# Patient Record
Sex: Female | Born: 1960 | Race: Black or African American | Hispanic: No | Marital: Married | Smoking: Never smoker
Health system: Southern US, Community
[De-identification: ages and names within clinical notes are randomized; demographics above are authoritative.]

## PROBLEM LIST (undated history)

## (undated) DIAGNOSIS — E7800 Pure hypercholesterolemia, unspecified: Secondary | ICD-10-CM

## (undated) DIAGNOSIS — I10 Essential (primary) hypertension: Secondary | ICD-10-CM

## (undated) DIAGNOSIS — M1990 Unspecified osteoarthritis, unspecified site: Secondary | ICD-10-CM

## (undated) DIAGNOSIS — E119 Type 2 diabetes mellitus without complications: Secondary | ICD-10-CM

---

## 2000-04-01 ENCOUNTER — Encounter: Payer: Self-pay | Admitting: Urology

## 2000-04-01 ENCOUNTER — Encounter: Admission: RE | Admit: 2000-04-01 | Discharge: 2000-04-01 | Payer: Self-pay | Admitting: Urology

## 2003-03-30 ENCOUNTER — Other Ambulatory Visit: Admission: RE | Admit: 2003-03-30 | Discharge: 2003-03-30 | Payer: Self-pay | Admitting: Family Medicine

## 2003-04-09 ENCOUNTER — Encounter: Admission: RE | Admit: 2003-04-09 | Discharge: 2003-04-09 | Payer: Self-pay | Admitting: Family Medicine

## 2003-04-19 ENCOUNTER — Encounter: Admission: RE | Admit: 2003-04-19 | Discharge: 2003-04-19 | Payer: Self-pay | Admitting: Family Medicine

## 2004-09-05 ENCOUNTER — Other Ambulatory Visit: Admission: RE | Admit: 2004-09-05 | Discharge: 2004-09-05 | Payer: Self-pay | Admitting: Family Medicine

## 2004-09-22 ENCOUNTER — Encounter: Admission: RE | Admit: 2004-09-22 | Discharge: 2004-09-22 | Payer: Self-pay | Admitting: Family Medicine

## 2005-04-11 ENCOUNTER — Ambulatory Visit (HOSPITAL_COMMUNITY): Admission: RE | Admit: 2005-04-11 | Discharge: 2005-04-11 | Payer: Self-pay | Admitting: Obstetrics and Gynecology

## 2005-04-11 ENCOUNTER — Encounter (INDEPENDENT_AMBULATORY_CARE_PROVIDER_SITE_OTHER): Payer: Self-pay | Admitting: *Deleted

## 2006-03-01 ENCOUNTER — Other Ambulatory Visit: Admission: RE | Admit: 2006-03-01 | Discharge: 2006-03-01 | Payer: Self-pay | Admitting: Family Medicine

## 2007-03-23 ENCOUNTER — Emergency Department (HOSPITAL_COMMUNITY): Admission: EM | Admit: 2007-03-23 | Discharge: 2007-03-23 | Payer: Self-pay | Admitting: Emergency Medicine

## 2007-03-24 ENCOUNTER — Other Ambulatory Visit: Admission: RE | Admit: 2007-03-24 | Discharge: 2007-03-24 | Payer: Self-pay | Admitting: Family Medicine

## 2008-03-29 ENCOUNTER — Other Ambulatory Visit: Admission: RE | Admit: 2008-03-29 | Discharge: 2008-03-29 | Payer: Self-pay | Admitting: Family Medicine

## 2009-04-01 ENCOUNTER — Other Ambulatory Visit: Admission: RE | Admit: 2009-04-01 | Discharge: 2009-04-01 | Payer: Self-pay | Admitting: Family Medicine

## 2010-01-29 ENCOUNTER — Encounter: Payer: Self-pay | Admitting: Family Medicine

## 2010-02-02 ENCOUNTER — Encounter
Admission: RE | Admit: 2010-02-02 | Discharge: 2010-02-02 | Payer: Self-pay | Source: Home / Self Care | Attending: Family Medicine | Admitting: Family Medicine

## 2010-05-26 NOTE — H&P (Signed)
NAME:  Yolanda Graham, Yolanda Graham              ACCOUNT NO.:  [id number]   MEDICAL RECORD NO.:  [id number]          PATIENT TYPE:  AMB   LOCATION:  SDC                           FACILITY:  [facility]   PHYSICIAN:  Charles A. Delcambre, MDDATE OF BIRTH:  1960-07-26   DATE OF ADMISSION:  DATE OF DISCHARGE:                                HISTORY & PHYSICAL   CHIEF COMPLAINT:  Irregular bleeding and postcoital bleeding.   HISTORY OF PRESENT ILLNESS:  A 50 year old para 2-0-0-2, PCP is Dr. Modesto Charon,  Deboraha Sprang at [facility], originally seen for endocervical polyp and now known  to have a large fundal endometrial polyp, to be admitted for hysteroscopy  and D&C for polypectomy.  She is to have clearance from Cardiology as well,  with history of a cardiac murmur.   MEDICAL HISTORY:  1.  Hypercholesterolemia.  2.  Cardiac murmur.  3.  History of kidney stones.   SURGICAL HISTORY:  C-section x1 and SVD x1.   MEDICATIONS:  Calcium, multivitamin and formerly an aspirin once a day.  She  has been off the aspirin for 1 week now at the time of this dictation.   ALLERGIES:  NO KNOWN DRUG ALLERGIES.   SOCIAL HISTORY:  No tobacco, ethanol or drug use, or STD exposure in the  past.  She is married and in a monogamous relationship with her husband.   FAMILY HISTORY:  Hypertension in her father.  She denies family history of  breast, uterus, ovaries, cervix, colon cancer and lymphoma, coronary artery  disease, stroke or diabetes.   REVIEW OF SYSTEMS:  She denies fever or chills.  No new rashes or lesions,  headaches or dizziness.  She does have some seasonal allergies.  No chest  pain, shortness of breath or wheezing.  No diarrhea, constipation, bleeding,  melena or hematochezia, urgency, frequency, dysuria incontinence or  hematuria.  No galactorrhea or emotional changes.   PHYSICAL EXAMINATION:  GENERAL:  Alert and oriented x3 in no distress.  Blood pressure 122/70, respirations 20, heart rate 80 and weight 126  pounds.  HEENT EXAMINATION:  Grossly within normal limits.  NECK:  Supple without thyromegaly or adenopathy.  LUNGS:  Clear bilaterally.  BACK:  No CVAT.  Vertebral column nontender to palpation.  HEART:  Regular rate and rhythm without a murmur that I could distinguish at  the apex or left sternal border, grossly, in the sitting position today with  general exam.  BREAST EXAM:  Breast are symmetrical, otherwise deferred.  ABDOMEN:  Soft, flat and nontender.  No hepatosplenomegaly or other masses  noted.  PELVIC EXAM:  Normal external female genitalia.  Bartholin's, urethral and  Skene's within normal limits.  Vulva without discharge or lesions.  Multiparous cervix is noted.  Uterus is not enlarged.  Adnexa nontender  without masses bilaterally.  Ovaries palpable and within normal size  bilaterally.  Anus and perineal body appear normal.  RECTAL EXAM:  Not done.   ASSESSMENT:  Irregular bleeding and postcoital bleeding.  A known  endometrial polyp on sonohystogram.   PLAN:  Admit for hysteroscopy, D&C and polypectomy on April 11, 2005.  She  gives informed consent and accepts the risks for uterine perforation, a  sorbitol overload, electrolyte imbalance, anesthesia risks in general,  infection, bleeding, damage to bowel or bladder, or vascular structures,  transfusion risks of hepatitis and HIV exposure and failed procedure.  All  questions are answered.  She gives the informed consent in this regard.  Preoperatively, we will get a CBC, a CMET and serum pregnancy test, an EKG  and chest per Anesthesia.  Secondary to the murmur and cardiac history, we  will preop for mitral valve prolapse, endocarditis prophylaxis with  ampicillin 2 g plus gentamicin 1.5 mg/kg or 120 mg, whichever is less, IV in  the preop holding.  She is also to get surgical clearance from Cardiology  and has this appointment within the next several days prior to surgery and  per Dr. Nash Dimmer recommendation as well,  and we will look for this clearance  prior to surgery.  All questions are answered and she will remain n.p.o.  past midnight the evening prior to surgery, and we will proceed as outlined.      Charles A. Sydnee Cabal, MD  Electronically Signed     CAD/MEDQ  D:  04/03/2005  T:  04/04/2005  Job:  [id number]

## 2010-05-26 NOTE — Op Note (Signed)
NAME:  Yolanda Graham, Yolanda Graham              ACCOUNT NO.:  [id number]   MEDICAL RECORD NO.:  [id number]          PATIENT TYPE:  AMB   LOCATION:  SDC                           [facility]ILITY:  [facility]   PHYSICIAN:  Charles A. Delcambre, MDDATE OF BIRTH:  1960-04-21   DATE OF PROCEDURE:  DATE OF DISCHARGE:                                 OPERATIVE REPORT   PREOPERATIVE DIAGNOSES:  1.  Irregular bleeding.  2.  Postcoital bleeding.  3.  Endometrial polyp.   POSTOPERATIVE DIAGNOSES:  1.  Irregular bleeding.  2.  Postcoital bleeding.  3.  Endometrial polyp.   PROCEDURES:  1.  Paracervical block.  2.  Hysteroscopy.  3.  Dilation and curettage.  4.  Polypectomy.   SURGEON:  Charles A. Sydnee Cabal, M.D.   ASSISTANT:  None.   COMPLICATIONS:  None.   ESTIMATED BLOOD LOSS:  Less than 25 mL.   SPECIMENS:  Endometrial polyp and endometrial curettings to pathology.   ANESTHESIA:  General by the laryngeal route.   FINDINGS:  A large fundal endometrial polyp noted, normal cavity otherwise.   SORBITOL LOSS:  Nil.   Instrument, sponge and needle count correct x2.   DESCRIPTION OF PROCEDURE:  The patient was taken to the operating room and  placed in the supine position.  General anesthetic was used without  difficulty.  She was then placed in the dorsal lithotomy position and  universal stirrups and a sterile prep and drape was undertaken.  A weighted  speculum was placed in the vagina.  The anterior lip of the cervix was  grasped with a single-tooth tenaculum.  A paracervical block with 0.25%  plain Marcaine was placed at 4 and 8 o'clock, a total of 10 mL divided  equally without intravascular location of the injection noted.  The sound  was to 7 cm.  Next dilators were used to dilate enough to pass the 5 mm  hysteroscope.  The hysteroscope was passed and the fundal polyp was noted.  Findings are as noted above.  Using polyp forceps, the polyp was removed.  Circumferential curetting with the banjo  curette was then undertaken.  Specimens were sent to pathology.  There was no evidence of perforation.  All instruments were removed.  Hemostasis was excellent at the tenaculum  site as well as the uterus.  The patient tolerated the procedure well and  the procedure was terminated.  The patient was taken to the recovery room in  good condition with physician in attendance and having tolerated the  procedure well.      Charles A. Sydnee Cabal, MD  Electronically Signed     CAD/MEDQ  D:  04/11/2005  T:  04/12/2005  Job:  [id number]

## 2010-05-26 NOTE — H&P (Signed)
NAME:  Yolanda Graham, Yolanda Graham              ACCOUNT NO.:  [id number]   MEDICAL RECORD NO.:  [id number]          PATIENT TYPE:  AMB   LOCATION:  SDC                           [facility]   PHYSICIAN:  Charles A. Delcambre, MDDATE OF BIRTH:  08-30-1960   DATE OF ADMISSION:  DATE OF DISCHARGE:                                HISTORY & PHYSICAL   ADDENDUM:  This patient did have a workup with cardiologist, Everette Rank,  M.D. 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      Charles A. Sydnee Cabal, MD  Electronically Signed     CAD/MEDQ  D:  04/05/2005  T:  04/05/2005  Job:  [id number]

## 2010-07-03 ENCOUNTER — Ambulatory Visit (HOSPITAL_COMMUNITY)
Admission: RE | Admit: 2010-07-03 | Discharge: 2010-07-03 | Disposition: A | Discharge status: Home / Self Care | Payer: PRIVATE HEALTH INSURANCE | Source: Ambulatory Visit | Attending: Urology | Admitting: Urology

## 2010-07-03 DIAGNOSIS — N200 Calculus of kidney: Secondary | ICD-10-CM | POA: Insufficient documentation

## 2012-05-23 ENCOUNTER — Other Ambulatory Visit: Payer: Self-pay | Admitting: Family Medicine

## 2012-05-23 ENCOUNTER — Other Ambulatory Visit (HOSPITAL_COMMUNITY)
Admission: RE | Admit: 2012-05-23 | Discharge: 2012-05-23 | Disposition: A | Discharge status: Home / Self Care | Payer: PRIVATE HEALTH INSURANCE | Source: Ambulatory Visit | Attending: Family Medicine | Admitting: Family Medicine

## 2012-05-23 DIAGNOSIS — Z124 Encounter for screening for malignant neoplasm of cervix: Secondary | ICD-10-CM | POA: Insufficient documentation

## 2013-05-29 ENCOUNTER — Other Ambulatory Visit: Payer: Self-pay | Admitting: Family Medicine

## 2013-05-29 DIAGNOSIS — Z1231 Encounter for screening mammogram for malignant neoplasm of breast: Secondary | ICD-10-CM

## 2013-08-07 ENCOUNTER — Other Ambulatory Visit: Payer: Self-pay | Admitting: Family Medicine

## 2013-08-07 ENCOUNTER — Ambulatory Visit
Admission: RE | Admit: 2013-08-07 | Discharge: 2013-08-07 | Disposition: A | Discharge status: Home / Self Care | Payer: PRIVATE HEALTH INSURANCE | Source: Ambulatory Visit | Attending: Family Medicine | Admitting: Family Medicine

## 2013-08-07 DIAGNOSIS — R109 Unspecified abdominal pain: Secondary | ICD-10-CM

## 2013-10-16 ENCOUNTER — Other Ambulatory Visit: Payer: Self-pay | Admitting: Urology

## 2013-10-22 ENCOUNTER — Encounter (HOSPITAL_COMMUNITY): Payer: Self-pay | Admitting: *Deleted

## 2013-10-26 ENCOUNTER — Ambulatory Visit (HOSPITAL_COMMUNITY)
Admission: RE | Admit: 2013-10-26 | Discharge: 2013-10-26 | Disposition: A | Discharge status: Home / Self Care | Payer: PRIVATE HEALTH INSURANCE | Source: Ambulatory Visit | Attending: Urology | Admitting: Urology

## 2013-10-26 ENCOUNTER — Encounter (HOSPITAL_COMMUNITY): Payer: Self-pay | Admitting: *Deleted

## 2013-10-26 ENCOUNTER — Ambulatory Visit (HOSPITAL_COMMUNITY): Payer: PRIVATE HEALTH INSURANCE

## 2013-10-26 ENCOUNTER — Encounter (HOSPITAL_COMMUNITY)
Admission: RE | Disposition: A | Discharge status: Home / Self Care | Payer: Self-pay | Source: Ambulatory Visit | Attending: Urology

## 2013-10-26 DIAGNOSIS — N132 Hydronephrosis with renal and ureteral calculous obstruction: Secondary | ICD-10-CM | POA: Diagnosis not present

## 2013-10-26 DIAGNOSIS — M1990 Unspecified osteoarthritis, unspecified site: Secondary | ICD-10-CM | POA: Insufficient documentation

## 2013-10-26 DIAGNOSIS — Z79899 Other long term (current) drug therapy: Secondary | ICD-10-CM | POA: Insufficient documentation

## 2013-10-26 DIAGNOSIS — N201 Calculus of ureter: Secondary | ICD-10-CM | POA: Diagnosis present

## 2013-10-26 DIAGNOSIS — Z7982 Long term (current) use of aspirin: Secondary | ICD-10-CM | POA: Insufficient documentation

## 2013-10-26 DIAGNOSIS — E780 Pure hypercholesterolemia: Secondary | ICD-10-CM | POA: Insufficient documentation

## 2013-10-26 HISTORY — DX: Type 2 diabetes mellitus without complications: E11.9

## 2013-10-26 HISTORY — DX: Unspecified osteoarthritis, unspecified site: M19.90

## 2013-10-26 HISTORY — DX: Pure hypercholesterolemia, unspecified: E78.00

## 2013-10-26 LAB — GLUCOSE, CAPILLARY: Glucose-Capillary: 110 mg/dL — ABNORMAL HIGH (ref 70–99)

## 2013-10-26 SURGERY — LITHOTRIPSY, ESWL
Anesthesia: LOCAL | Laterality: Left

## 2013-10-26 MED ORDER — DIAZEPAM 5 MG PO TABS
10.0000 mg | ORAL_TABLET | ORAL | Status: AC
  Administered 2013-10-26: 10 mg via ORAL
  Filled 2013-10-26: qty 2

## 2013-10-26 MED ORDER — DEXTROSE-NACL 5-0.45 % IV SOLN
INTRAVENOUS | Status: DC
  Administered 2013-10-26: 12:00:00 via INTRAVENOUS

## 2013-10-26 MED ORDER — CIPROFLOXACIN HCL 500 MG PO TABS
500.0000 mg | ORAL_TABLET | ORAL | Status: AC
  Administered 2013-10-26: 500 mg via ORAL
  Filled 2013-10-26: qty 1

## 2013-10-26 MED ORDER — DIPHENHYDRAMINE HCL 25 MG PO CAPS
25.0000 mg | ORAL_CAPSULE | ORAL | Status: AC
  Administered 2013-10-26: 25 mg via ORAL
  Filled 2013-10-26: qty 1

## 2013-10-26 NOTE — Op Note (Signed)
Refer to [facility] Op Note scanned in the chart 

## 2013-10-26 NOTE — H&P (Signed)
  History of Present Illness Yolanda Graham was seen 2 weeks ago by Dr Mila PalmerSharon Wolters for left sided abdominal pain. KUB showed a 5 mm left proximal ureteral stone at L2-L3 and a probable left renal stone. The pain persisted for 2 days. She has not had any pain since. She has not been straining her urine. She is not sure whether she passed a stone or not. She was started on tamsulosin. Urinalysis shows 11-20 RBC's, 0-2 WBC's, rare bacteria, rare epithelial cells. CT scan showed a 7 x 5 mm stone in the proximal left ureter with moderate hydronephrosis and a 2 mm stone in the lower pole of the left kidney.   Past Medical History Problems  1. History of Arthritis (V13.4) 2. History of hypercholesterolemia (V12.29) 3. History of Murmur (785.2)  Surgical History Problems  1. History of Dilation And Curettage 2. History of Hysteroscopy 3. History of Lithotripsy  Current Meds 1. Aspirin 81 MG Oral Tablet;  Therapy: (Recorded:27Dec2007) to Recorded 2. Lipitor 20 MG Oral Tablet;  Therapy: (Recorded:27Dec2007) to Recorded 3. Multi-Vitamin Oral Tablet;  Therapy: (Recorded:27Dec2007) to Recorded  Allergies Medication  1. No Known Drug Allergies  Family History Problems  1. Family history of Family Health Status Number Of Children   1 son and 1 daughter  Social History Problems  1. Denied: Alcohol Use 2. Caffeine Use   1 per day 3. Marital History - Currently Married 4. Never A Smoker 5. Occupation:   CNA 6. Denied: Tobacco Use (V15.82)  Review of Systems Genitourinary, constitutional, skin, eye, otolaryngeal, hematologic/lymphatic, cardiovascular, pulmonary, endocrine, musculoskeletal, gastrointestinal, neurological and psychiatric system(s) were reviewed and pertinent findings if present are noted.  Gastrointestinal: abdominal pain.    Vitals Vital Signs [Data Includes: Last 1 Day]  Recorded: 24Aug2015 01:13PM  Blood Pressure: 152 / 81 Temperature: 98.1 F Heart Rate:  88  Physical Exam Constitutional: Well nourished and well developed . No acute distress.  ENT:. The ears and nose are normal in appearance.  Neck: The appearance of the neck is normal and no neck mass is present.  Pulmonary: No respiratory distress and normal respiratory rhythm and effort.  Cardiovascular: Heart rate and rhythm are normal . No peripheral edema.  Abdomen: The abdomen is soft and nontender. No masses are palpated. No CVA tenderness. No hernias are palpable. No hepatosplenomegaly noted.  Genitourinary:  The bladder is non tender and not distended.  Lymphatics: The femoral and inguinal nodes are not enlarged or tender.  Skin: Normal skin turgor, no visible rash and no visible skin lesions.  Neuro/Psych:. Mood and affect are appropriate.    Results/Data Urine [Data Includes: Last 1 Day]   24Aug2015 COLOR YELLOW  APPEARANCE CLEAR  SPECIFIC GRAVITY 1.015  pH 7.0  GLUCOSE NEG mg/dL BILIRUBIN NEG  KETONE NEG mg/dL BLOOD MOD  PROTEIN NEG mg/dL UROBILINOGEN 0.2 mg/dL NITRITE NEG  LEUKOCYTE ESTERASE NEG  SQUAMOUS EPITHELIAL/HPF RARE  WBC 0-2 WBC/hpf RBC 11-20 RBC/hpf BACTERIA RARE  CRYSTALS NSNS  CASTS NONE SEEN   Assessment Assessed  1. Calculus of left ureter (592.1) 2. Hydronephrosis, left (591) 3. Calculus of left kidney (592.0)  Plan: ESL left ureteral calculus.  The procedure, risks, benefits were reviewed with the patient.  The risks include but are not limited to hemorrhage, renal or perirenal hematoma, injury to adjacent organs, steinstrasse, inability to fragment the stone.  She understands and wishes to proceed.

## 2014-01-18 ENCOUNTER — Other Ambulatory Visit: Payer: Self-pay | Admitting: Family Medicine

## 2014-01-18 ENCOUNTER — Ambulatory Visit
Admission: RE | Admit: 2014-01-18 | Discharge: 2014-01-18 | Disposition: A | Discharge status: Home / Self Care | Payer: PRIVATE HEALTH INSURANCE | Source: Ambulatory Visit | Attending: Family Medicine | Admitting: Family Medicine

## 2014-01-18 DIAGNOSIS — M654 Radial styloid tenosynovitis [de Quervain]: Secondary | ICD-10-CM

## 2015-08-05 ENCOUNTER — Other Ambulatory Visit (HOSPITAL_COMMUNITY)
Admission: RE | Admit: 2015-08-05 | Discharge: 2015-08-05 | Disposition: A | Discharge status: Home / Self Care | Payer: PRIVATE HEALTH INSURANCE | Source: Ambulatory Visit | Attending: Family Medicine | Admitting: Family Medicine

## 2015-08-05 ENCOUNTER — Other Ambulatory Visit: Payer: Self-pay | Admitting: Family Medicine

## 2015-08-05 DIAGNOSIS — Z1151 Encounter for screening for human papillomavirus (HPV): Secondary | ICD-10-CM | POA: Diagnosis present

## 2015-08-05 DIAGNOSIS — Z01411 Encounter for gynecological examination (general) (routine) with abnormal findings: Secondary | ICD-10-CM | POA: Insufficient documentation

## 2015-08-09 LAB — CYTOLOGY - PAP

## 2015-11-10 ENCOUNTER — Ambulatory Visit
Admission: RE | Admit: 2015-11-10 | Discharge: 2015-11-10 | Disposition: A | Discharge status: Home / Self Care | Payer: PRIVATE HEALTH INSURANCE | Source: Ambulatory Visit | Attending: Family Medicine | Admitting: Family Medicine

## 2015-11-10 ENCOUNTER — Other Ambulatory Visit: Payer: Self-pay | Admitting: Family Medicine

## 2015-11-10 DIAGNOSIS — M25561 Pain in right knee: Secondary | ICD-10-CM

## 2018-06-06 ENCOUNTER — Encounter: Payer: Self-pay | Admitting: Nurse Practitioner

## 2018-06-06 DIAGNOSIS — I10 Essential (primary) hypertension: Secondary | ICD-10-CM | POA: Insufficient documentation

## 2018-06-06 NOTE — Progress Notes (Signed)
Subjective:    Yolanda Graham is a 58 y.o. female who presents for evaluation of right ring finger pad pain. Onset was sudden, not related to any specific activity. The pain is moderate, worsens with palpatin, and is relieved by keeping it protected. There is no associated numbness, tingling, weakness in right ring finger, right hand. Evaluation to date: none. Treatment to date: nothing specific.  The following portions of the patient's history were reviewed and updated as appropriate: allergies, current medications and past medical history.  Review of Systems Constitutional: negative Respiratory: negative Cardiovascular: negative Musculoskeletal:positive for right ring finger pain, negative for arthralgias, back pain, bone pain, muscle weakness, myalgias, neck pain, stiff joints and right hand/right middle finger injury    Objective:  Blood pressure 138/80, pulse 68, temperature 97.6 F (36.4 C), temperature source Skin, weight 121 lb (54.9 kg).  Physical Exam Vitals signs reviewed.  Constitutional:      General: She is not in acute distress. Cardiovascular:     Rate and Rhythm: Normal rate and regular rhythm.     Pulses: Normal pulses.     Heart sounds: Normal heart sounds.  Pulmonary:     Effort: Pulmonary effort is normal. No respiratory distress.     Breath sounds: Normal breath sounds. No stridor. No wheezing, rhonchi or rales.  Musculoskeletal:        General: Swelling (finger pad of right ring finger) and tenderness (finger pad of right ring finger) present. No deformity or signs of injury.     Right hand: She exhibits normal range of motion, no bony tenderness, normal capillary refill and no deformity. Normal sensation noted. Normal strength noted.       Hands:     Comments: Mild soft tissue tenderness and swelling to the finger pad of right ring finger. Sensation is intact to all fingers, +2 radial pulse, no decrepitus or deformity noted. Cap refill >2 sec.   Skin:  General: Skin is warm and dry.     Capillary Refill: Capillary refill takes less than 2 seconds.  Neurological:     Mental Status: She is alert.    Assessment:   Right Ring Finger Pain  Plan:   Exam findings, diagnosis etiology and medication use and indications reviewed with patient. Follow- Up and discharge instructions provided. No emergent/urgent issues found on exam. Patient education was provided. Patient verbalized understanding of information provided and agrees with plan of care (POC), all questions answered. The patient is advised to call or return to clinic if condition does not see an improvement in symptoms, or to seek the care of the closest emergency department if condition worsens with the above plan.   -Begin Epsom salt soaks 3-4 times/day for at least 10-15 minutes to help with pain and swelling. -Take Ibuprofen or Tylenol for pain, fever or general discomfort. -Attempt to refrain from hitting the finger to prevent further pain. -Follow up in the urgent care if you have worsening pain, increased swelling, increased redness extending toward the hand/elbow or other concerns. -Follow up with me in 5 days to ensure symptoms are improving and to determine if further treatment is needed.

## 2018-06-10 ENCOUNTER — Ambulatory Visit: Payer: PRIVATE HEALTH INSURANCE | Admitting: Nurse Practitioner

## 2018-06-10 ENCOUNTER — Other Ambulatory Visit: Payer: Self-pay

## 2018-06-10 VITALS — BP 124/78 | HR 75 | Temp 98.9°F | Resp 18 | Wt 121.0 lb

## 2018-06-10 DIAGNOSIS — L03011 Cellulitis of right finger: Secondary | ICD-10-CM

## 2018-06-10 MED ORDER — MUPIROCIN 2 % EX OINT: 1.0000 "application " | TOPICAL_OINTMENT | Freq: Two times a day (BID) | CUTANEOUS | 0 refills | Status: AC

## 2018-06-10 MED ORDER — CEPHALEXIN 500 MG PO CAPS: 500.0000 mg | ORAL_CAPSULE | Freq: Four times a day (QID) | ORAL | 0 refills | Status: AC

## 2018-06-10 NOTE — Progress Notes (Signed)
Subjective:    Yolanda Graham is a 58 y.o. female who returns for follow up for right ring finger pain. Onset was sudden, not related to any specific activity. The pain continues with increased swelling, erythema and pain since she was last seen on 5/28.  The patient continues to complain of pain with movement, and is relieved by rest. Rates current pain 5/10 at present. The patient denies fever, chills, malaise, abdominal pain, streaking up the right arm/hand, There is no associated numbness, tingling, weakness in right hand. Treatment to date: OTC analgesics, avoidance of offending activity, Epsom salt soaks.  Patient also informs she "popped it" earlier today, which helped relieve some pressure.   The following portions of the patient's history were reviewed and updated as appropriate: allergies, current medications and past medical history.  Review of Systems Constitutional: negative Respiratory: negative Cardiovascular: negative Gastrointestinal: negative Musculoskeletal:positive for right ring finger pain, swelling, negative for muscle weakness, myalgias and stiff joints Neurological: negative    Objective:    BP 124/78 (BP Location: Right Arm, Patient Position: Sitting, Cuff Size: Normal)   Pulse 75   Temp 98.9 F (37.2 C) (Temporal)   Resp 18   Wt 121 lb (54.9 kg) Comment: stated  BMI 24.86 kg/m  Right hand:  soft tissue tenderness and swelling at the right ring finger nailbed, including proximal and lateral nail folds.  Swelling and redness extends to opposite side of the nail with a "runaround infection" appearance.  Ligaments intact, FROM both hands, wrists and finger joints  Left hand:  normal exam, no swelling, tenderness, instability; ligaments intact, full ROM both hands, wrists, and finger joints          Assessment:    Paronychia of Right Ring Finger    Plan:   Exam findings, diagnosis etiology and medication use and indications reviewed with patient. Follow-  Up and discharge instructions provided. No emergent/urgent issues found on exam. The patient returns with worsening swelling, erythema to the proximal and lateral nail folds which is consistent with a paronychia.  Patient refused I & D today, so we will treat with Keflex and mupirocin. Patient will continue Epsom salt soaks and use of Ibuprofen or Tylenol for pain.  Again reviewed indications for patient to go to the ER to include fever, chills, malaise, abdominal pain, worsening swelling, increasing redness and streaking up the right arm.  Requested patient follow up with my colleague in 2 days at the [facility]; however, patient informed she will not have time.  Reiterated to patient indications to go to the ER if her symptoms do not improve.   The patient was instructed to keep the finger protected while at work.   Patient education was provided. Patient verbalized understanding of information provided and agrees with plan of care (POC), all questions answered. The patient is advised to call or return to clinic if condition does not see an improvement in symptoms, or to seek the care of the closest emergency department if condition worsens with the above plan.   1. Paronychia of right ring finger  - cephALEXin (KEFLEX) 500 MG capsule; Take 1 capsule (500 mg total) by mouth 4 (four) times daily for 7 days.  Dispense: 28 capsule; Refill: 0 - mupirocin ointment (BACTROBAN) 2 %; Apply 1 application topically 2 (two) times daily for 14 days.  Dispense: 28 g; Refill: 0

## 2019-01-05 ENCOUNTER — Other Ambulatory Visit (HOSPITAL_COMMUNITY)
Admission: RE | Admit: 2019-01-05 | Discharge: 2019-01-05 | Disposition: A | Discharge status: Home / Self Care | Payer: PRIVATE HEALTH INSURANCE | Source: Ambulatory Visit | Attending: Family Medicine | Admitting: Family Medicine

## 2019-01-05 ENCOUNTER — Other Ambulatory Visit: Payer: Self-pay | Admitting: Family Medicine

## 2019-01-05 DIAGNOSIS — Z01411 Encounter for gynecological examination (general) (routine) with abnormal findings: Secondary | ICD-10-CM | POA: Insufficient documentation

## 2019-01-07 LAB — CYTOLOGY - PAP: Diagnosis: NEGATIVE

## 2019-01-08 ENCOUNTER — Ambulatory Visit: Payer: PRIVATE HEALTH INSURANCE

## 2019-02-06 ENCOUNTER — Other Ambulatory Visit: Payer: Self-pay

## 2019-02-06 ENCOUNTER — Encounter (HOSPITAL_BASED_OUTPATIENT_CLINIC_OR_DEPARTMENT_OTHER): Payer: Self-pay | Admitting: Emergency Medicine

## 2019-02-06 ENCOUNTER — Emergency Department (HOSPITAL_BASED_OUTPATIENT_CLINIC_OR_DEPARTMENT_OTHER)
Admission: EM | Admit: 2019-02-06 | Discharge: 2019-02-06 | Disposition: A | Discharge status: Home / Self Care | Payer: Worker's Compensation | Attending: Emergency Medicine | Admitting: Emergency Medicine

## 2019-02-06 ENCOUNTER — Emergency Department (HOSPITAL_BASED_OUTPATIENT_CLINIC_OR_DEPARTMENT_OTHER): Payer: Worker's Compensation

## 2019-02-06 DIAGNOSIS — Y990 Civilian activity done for income or pay: Secondary | ICD-10-CM | POA: Insufficient documentation

## 2019-02-06 DIAGNOSIS — Z79899 Other long term (current) drug therapy: Secondary | ICD-10-CM | POA: Insufficient documentation

## 2019-02-06 DIAGNOSIS — S0990XA Unspecified injury of head, initial encounter: Secondary | ICD-10-CM | POA: Diagnosis not present

## 2019-02-06 DIAGNOSIS — E119 Type 2 diabetes mellitus without complications: Secondary | ICD-10-CM | POA: Insufficient documentation

## 2019-02-06 DIAGNOSIS — E782 Mixed hyperlipidemia: Secondary | ICD-10-CM | POA: Diagnosis not present

## 2019-02-06 DIAGNOSIS — Z7982 Long term (current) use of aspirin: Secondary | ICD-10-CM | POA: Insufficient documentation

## 2019-02-06 DIAGNOSIS — Y929 Unspecified place or not applicable: Secondary | ICD-10-CM | POA: Insufficient documentation

## 2019-02-06 DIAGNOSIS — W000XXA Fall on same level due to ice and snow, initial encounter: Secondary | ICD-10-CM | POA: Diagnosis not present

## 2019-02-06 DIAGNOSIS — Y9301 Activity, walking, marching and hiking: Secondary | ICD-10-CM | POA: Diagnosis not present

## 2019-02-06 DIAGNOSIS — I10 Essential (primary) hypertension: Secondary | ICD-10-CM | POA: Diagnosis not present

## 2019-02-06 DIAGNOSIS — W19XXXA Unspecified fall, initial encounter: Secondary | ICD-10-CM

## 2019-02-06 HISTORY — DX: Essential (primary) hypertension: I10

## 2019-02-06 NOTE — ED Triage Notes (Signed)
Fall outside her workplace, states she slipped on ice and hit the back of her head. No LOC. Took motrin PTA. Worker's comp case

## 2019-02-06 NOTE — ED Provider Notes (Signed)
[facility] EMERGENCY DEPARTMENT Provider Note   CSN: [id number] Arrival date & time: 02/06/19  0018     History Chief Complaint  Patient presents with  . Fall    Yolanda Graham is a 59 y.o. female.  The history is provided by the patient.  Fall This is a new problem. The current episode started less than 1 hour ago. The problem occurs constantly. The problem has not changed since onset.Pertinent negatives include no chest pain, no abdominal pain and no shortness of breath. Associated symptoms comments: Hit back of the head took ibuprofen. Nothing aggravates the symptoms. Nothing relieves the symptoms. Treatments tried: ibuprofen. The treatment provided mild relief.       Past Medical History:  Diagnosis Date  . Arthritis   . Diabetes mellitus without complication (HCC)    diet controlled. no meds.  . High cholesterol   . Hypertension     Patient Active Problem List   Diagnosis Date Noted  . Hypertension 06/06/2018    Past Surgical History:  Procedure Laterality Date  . CESAREAN SECTION       OB History   No obstetric history on file.     History reviewed. No pertinent family history.  Social History   Tobacco Use  . Smoking status: Never Smoker  . Smokeless tobacco: Never Used  Substance Use Topics  . Alcohol use: Yes    Comment: occassional   . Drug use: Not on file    Home Medications Prior to Admission medications   Medication Sig Start Date End Date Taking? Authorizing Provider  aspirin 81 MG tablet Take 81 mg by mouth daily.    [provider]  atorvastatin (LIPITOR) 10 MG tablet Take 10 mg by mouth every evening.    [provider]  Cholecalciferol (VITAMIN D PO) Take 1 capsule by mouth daily. 600units    [provider]    Allergies    Oxycodone  Review of Systems   Review of Systems  Constitutional: Negative for fever.  HENT: Negative for congestion.   Eyes: Negative for visual disturbance.    Respiratory: Negative for cough and shortness of breath.   Cardiovascular: Negative for chest pain.  Gastrointestinal: Negative for abdominal pain.  Genitourinary: Negative for difficulty urinating.  Musculoskeletal: Negative for neck pain.  Neurological: Negative for dizziness, seizures, facial asymmetry, speech difficulty, light-headedness and numbness.  Psychiatric/Behavioral: Negative for agitation.  All other systems reviewed and are negative.   Physical Exam Updated Vital Signs BP (!) 161/86 (BP Location: Left Arm)   Pulse (!) 111   Temp 99.1 F (37.3 C) (Oral)   Resp 16   Wt 55.2 kg   SpO2 99%   BMI 25.00 kg/m   Physical Exam Vitals and nursing note reviewed.  Constitutional:      General: She is not in acute distress.    Appearance: Normal appearance.  HENT:     Head: Normocephalic and atraumatic.     Nose: Nose normal.     Mouth/Throat:     Mouth: Mucous membranes are moist.     Pharynx: Oropharynx is clear.  Eyes:     Conjunctiva/sclera: Conjunctivae normal.     Pupils: Pupils are equal, round, and reactive to light.  Cardiovascular:     Rate and Rhythm: Normal rate and regular rhythm.     Pulses: Normal pulses.     Heart sounds: Normal heart sounds.  Pulmonary:     Effort: Pulmonary effort is normal.  Breath sounds: Normal breath sounds.  Abdominal:     General: Abdomen is flat. Bowel sounds are normal.     Tenderness: There is no abdominal tenderness. There is no guarding.  Musculoskeletal:        General: Normal range of motion.     Cervical back: Normal range of motion and neck supple.  Skin:    General: Skin is warm and dry.     Capillary Refill: Capillary refill takes less than 2 seconds.  Neurological:     General: No focal deficit present.     Mental Status: She is alert and oriented to person, place, and time.     Deep Tendon Reflexes: Reflexes normal.  Psychiatric:        Mood and Affect: Mood normal.        Behavior: Behavior normal.      ED Results / Procedures / Treatments   Labs (all labs ordered are listed, but only abnormal results are displayed) Labs Reviewed - No data to display  EKG None  Radiology CT Head Wo Contrast  Result Date: 02/06/2019 CLINICAL DATA:  Fall, hit back of head. EXAM: CT HEAD WITHOUT CONTRAST TECHNIQUE: Contiguous axial images were obtained from the base of the skull through the vertex without intravenous contrast. COMPARISON:  None. FINDINGS: Brain: No acute intracranial abnormality. Specifically, no hemorrhage, hydrocephalus, mass lesion, acute infarction, or significant intracranial injury. Vascular: No hyperdense vessel or unexpected calcification. Skull: No acute calvarial abnormality. Sinuses/Orbits: Visualized paranasal sinuses and mastoids clear. Orbital soft tissues unremarkable. Other: None IMPRESSION: Normal study. Electronically Signed   By: Charlett Nose M.D.   On: 02/06/2019 00:51   CT Cervical Spine Wo Contrast  Result Date: 02/06/2019 CLINICAL DATA:  Fall, hit back of head EXAM: CT CERVICAL SPINE WITHOUT CONTRAST TECHNIQUE: Multidetector CT imaging of the cervical spine was performed without intravenous contrast. Multiplanar CT image reconstructions were also generated. COMPARISON:  None. FINDINGS: Alignment: No subluxation. Skull base and vertebrae: No acute fracture. No primary bone lesion or focal pathologic process. Soft tissues and spinal canal: No prevertebral fluid or swelling. No visible canal hematoma. Disc levels: Advanced degenerative disc disease at C5-6 and C6-7. Moderate bilateral degenerative facet disease. Upper chest: No acute findings Other: None IMPRESSION: Cervical spondylosis.  No acute bony abnormality. Electronically Signed   By: Charlett Nose M.D.   On: 02/06/2019 00:53    Procedures Procedures (including critical care time)  Medications Ordered in ED Medications - No data to display  ED Course  I have reviewed the triage vital signs and the nursing  notes.  Pertinent labs & imaging results that were available during my care of the patient were reviewed by me and considered in my medical decision making (see chart for details).    Scans negative, low risk mechanism of injury.  Ice tylenol and ibuprofen.   Yolanda Graham was evaluated in Emergency Department on 02/06/2019 for the symptoms described in the history of present illness. She was evaluated in the context of the global COVID-19 pandemic, which necessitated consideration that the patient might be at risk for infection with the SARS-CoV-2 virus that causes COVID-19. Institutional protocols and algorithms that pertain to the evaluation of patients at risk for COVID-19 are in a state of rapid change based on information released by regulatory bodies including the CDC and federal and state organizations. These policies and algorithms were followed during the patient's care in the ED.  Final Clinical Impression(s) / ED Diagnoses Final diagnoses:  Fall, initial encounter   Return for weakness, numbness, changes in vision or speech, fevers >100.4 unrelieved by medication, shortness of breath, intractable vomiting, or diarrhea, abdominal pain, Inability to tolerate liquids or food, cough, altered mental status or any concerns. No signs of systemic illness or infection. The patient is nontoxic-appearing on exam and vital signs are within normal limits.   I have reviewed the triage vital signs and the nursing notes. Pertinent labs &imaging results that were available during my care of the patient were reviewed by me and considered in my medical decision making (see chart for details).  After history, exam, and medical workup I feel the patient has been appropriately medically screened and is safe for discharge home. Pertinent diagnoses were discussed with the patient. Patient was given return precautions   Mayerli Kirst, MD 02/06/19 0100

## 2019-03-19 ENCOUNTER — Ambulatory Visit: Payer: Self-pay | Admitting: Family Medicine

## 2019-03-19 VITALS — BP 136/70 | HR 86 | Ht 59.0 in | Wt 117.0 lb

## 2019-03-19 NOTE — Progress Notes (Signed)
Subjective:     Patient ID: Yolanda Graham, female   DOB: 26-Jun-1960, 59 y.o.   MRN: [id number]  HPI Yolanda Graham presents to the employee health clinic today for her required wellness visit for her insurance. Her PCP is Dr. Paulino Rily who she states she saw in January. Reports regular visits with her. States she is utd on her mammogram, pap smear and colonoscopy. She reports she is vegetarian and eats healthy, states she tries to get outside and walk for exercise when she is able.   Today she reports some lower back pain, midline, worse with walking, no radiation, no weakness, states it has been intermittent since her fall in January, this episode has been over the last two days. She reports tylenol helps.   PCP Dr. Paulino Rily - Saw last in January Mammogram - December 2020 Pap smear - December 2020 Colonoscopy - 2 years ago - 10 year f/u   Past Medical History:  Diagnosis Date  . Arthritis   . Diabetes mellitus without complication (HCC)    diet controlled. no meds.  . High cholesterol   . Hypertension    Allergies  Allergen Reactions  . Oxycodone Nausea Only    Current Outpatient Medications:  .  aspirin 81 MG tablet, Take 81 mg by mouth daily., Disp: , Rfl:  .  atorvastatin (LIPITOR) 10 MG tablet, Take 10 mg by mouth every evening., Disp: , Rfl:  .  Cholecalciferol (VITAMIN D PO), Take 1 capsule by mouth daily. 600units, Disp: , Rfl:  .  losartan (COZAAR) 25 MG tablet, Take 25 mg by mouth daily., Disp: , Rfl:     Review of Systems  Constitutional: Negative for chills, fatigue, fever and unexpected weight change.  HENT: Negative for congestion, ear pain, sinus pressure, sinus pain and sore throat.   Eyes: Negative for discharge and visual disturbance.  Respiratory: Negative for cough, shortness of breath and wheezing.   Cardiovascular: Negative for chest pain and leg swelling.  Gastrointestinal: Negative for abdominal pain, blood in stool, constipation, diarrhea, nausea and vomiting.   Genitourinary: Negative for difficulty urinating and hematuria.  Musculoskeletal: Positive for back pain. Negative for gait problem, joint swelling and neck pain.  Skin: Negative for color change.  Neurological: Negative for dizziness, weakness, light-headedness, numbness and headaches.  Hematological: Negative for adenopathy.  All other systems reviewed and are negative.      Objective:   Physical Exam Vitals reviewed.  Constitutional:      General: She is not in acute distress.    Appearance: Normal appearance. She is well-developed.  HENT:     Head: Normocephalic and atraumatic.  Eyes:     General:        Right eye: No discharge.        Left eye: No discharge.  Cardiovascular:     Rate and Rhythm: Normal rate and regular rhythm.     Heart sounds: Normal heart sounds.  Pulmonary:     Effort: Pulmonary effort is normal. No respiratory distress.     Breath sounds: Normal breath sounds.  Musculoskeletal:        General: No tenderness. Normal range of motion.     Cervical back: Normal and neck supple. No bony tenderness. Normal range of motion.     Thoracic back: Normal. No tenderness or bony tenderness.     Lumbar back: No tenderness or bony tenderness. Negative right straight leg raise test and negative left straight leg raise test.     Right lower leg:  No edema.     Left lower leg: No edema.     Comments: Normal gait. Strength to bilateral lower extremities 5/5.   Skin:    General: Skin is warm and dry.  Neurological:     Mental Status: She is alert and oriented to person, place, and time.  Psychiatric:        Mood and Affect: Mood normal.        Behavior: Behavior normal.    Today's Vitals   03/19/19 1432  BP: 136/70  Pulse: 86  SpO2: 98%  Weight: 117 lb (53.1 kg)  Height: 4\' 11"  (1.499 m)   Body mass index is 23.63 kg/m.      Assessment:     1. Participant in health and wellness plan Keep all regular appts with PCP. Encouraged continued efforts at  healthy eating and exercise as able. F/u here prn.  2. Acute midline low back pain without sciatica Conservative treatment measures discussed. Heat application, otc tylenol or motrin for pain relief. Back exercises, handout given. F/u if worsening. ED precautions given.      Plan:     See above.

## 2019-03-19 NOTE — Patient Instructions (Signed)

## 2019-11-19 ENCOUNTER — Encounter: Payer: Self-pay | Admitting: Family Medicine

## 2019-11-19 ENCOUNTER — Ambulatory Visit: Payer: PRIVATE HEALTH INSURANCE | Admitting: Family Medicine

## 2019-11-19 VITALS — BP 142/70 | HR 87

## 2019-11-19 DIAGNOSIS — M25841 Other specified joint disorders, right hand: Secondary | ICD-10-CM

## 2019-11-19 NOTE — Progress Notes (Signed)
Subjective:     Patient ID: Yolanda Graham, female   DOB: 11/19/1960, 59 y.o.   MRN: [id number]  HPI Elandra presents to the employee health and wellness clinic for evaluation of 1 week hx of right middle finger swelling. No injury. No pain. No restriction of movement. No hx of similar.   Past Medical History:  Diagnosis Date  . Arthritis   . Diabetes mellitus without complication (HCC)    diet controlled. no meds.  . High cholesterol   . Hypertension    Allergies  Allergen Reactions  . Oxycodone Nausea Only    Current Outpatient Medications:  .  aspirin 81 MG tablet, Take 81 mg by mouth daily., Disp: , Rfl:  .  atorvastatin (LIPITOR) 10 MG tablet, Take 10 mg by mouth every evening., Disp: , Rfl:  .  Cholecalciferol (VITAMIN D PO), Take 1 capsule by mouth daily. 600units, Disp: , Rfl:  .  Cyanocobalamin (VITAMIN B-12 PO), Take by mouth., Disp: , Rfl:  .  ferrous sulfate 325 (65 FE) MG tablet, Take 325 mg by mouth daily with breakfast., Disp: , Rfl:  .  losartan (COZAAR) 25 MG tablet, Take 25 mg by mouth daily., Disp: , Rfl:    Review of Systems  Constitutional: Negative for chills and fever.  Musculoskeletal: Positive for joint swelling. Negative for arthralgias.  Skin: Negative for color change and rash.  Neurological: Negative for weakness and numbness.       Objective:   Physical Exam Vitals reviewed.  Constitutional:      General: She is not in acute distress.    Appearance: Normal appearance. She is well-developed.  HENT:     Head: Normocephalic and atraumatic.  Eyes:     General:        Right eye: No discharge.        Left eye: No discharge.  Cardiovascular:     Rate and Rhythm: Normal rate and regular rhythm.     Heart sounds: Normal heart sounds.  Pulmonary:     Effort: Pulmonary effort is normal. No respiratory distress.     Breath sounds: Normal breath sounds.  Musculoskeletal:     Right hand: Swelling present. No tenderness or bony tenderness. Normal  range of motion. Normal strength. Normal sensation. Normal capillary refill. Normal pulse.     Left hand: Normal.     Cervical back: Neck supple.     Comments: Right middle finger with 1 cm soft, fluctuant cyst-like area to dorsal aspect of PIP joint. Full ROM. Pulses intact. Sensation and strength intact. No surrounding erythema or warmth. Non-tender on palpation.   Skin:    General: Skin is warm and dry.  Neurological:     Mental Status: She is alert and oriented to person, place, and time.  Psychiatric:        Mood and Affect: Mood normal.        Behavior: Behavior normal.    Today's Vitals   11/19/19 1438  BP: (!) 142/70  Pulse: 87  SpO2: 97%   There is no height or weight on file to calculate BMI.      Assessment:     Cyst of joint of right hand      Plan:     1. Exam findings suggest benign cyst to middle finger PIP joint of right hand. Discussed with patient observation is recommended at this time. Should the cyst enlarge, become painful, firm, or restrict movement she should seek f/u evaluation.

## 2020-04-28 ENCOUNTER — Ambulatory Visit: Payer: PRIVATE HEALTH INSURANCE | Admitting: Family Medicine

## 2020-04-28 VITALS — BP 140/72 | HR 95 | Ht 59.0 in | Wt 119.0 lb

## 2020-04-28 DIAGNOSIS — Z789 Other specified health status: Secondary | ICD-10-CM

## 2020-04-28 NOTE — Progress Notes (Signed)
Subjective:     Patient ID: Yolanda Graham, female   DOB: 07-25-1960, 60 y.o.   MRN: [id number]  HPI  Hartlyn presents to the employee health and wellness clinic today for her required wellness exam for insurance. Her PCP is Dr. Paulino Rily. She is UTD on her health maintenance items. Gets mammogram yearly, last done end of 2021 per patient. She also states her colonoscopy was done 4-5 years ago and she was told to f/u in 10 years. She eats a healthy diet, she is vegetarian, and does yard work and takes walks during the warmer months. She denies any problems or concerns today.  Past Medical History:  Diagnosis Date  . Arthritis   . Diabetes mellitus without complication (HCC)    diet controlled. no meds.  . High cholesterol   . Hypertension    Allergies  Allergen Reactions  . Oxycodone Nausea Only    Current Outpatient Medications:  .  aspirin 81 MG tablet, Take 81 mg by mouth daily., Disp: , Rfl:  .  atorvastatin (LIPITOR) 10 MG tablet, Take 10 mg by mouth every evening., Disp: , Rfl:  .  Cholecalciferol (VITAMIN D PO), Take 1 capsule by mouth daily. 600units, Disp: , Rfl:  .  Cyanocobalamin (VITAMIN B-12 PO), Take by mouth., Disp: , Rfl:  .  ferrous sulfate 325 (65 FE) MG tablet, Take 325 mg by mouth daily with breakfast., Disp: , Rfl:  .  losartan (COZAAR) 25 MG tablet, Take 25 mg by mouth daily., Disp: , Rfl:     Review of Systems  Constitutional: Negative for chills, fatigue, fever and unexpected weight change.  HENT: Negative for congestion, ear pain, sinus pressure, sinus pain and sore throat.   Eyes: Negative for discharge and visual disturbance.  Respiratory: Negative for cough, shortness of breath and wheezing.   Cardiovascular: Negative for chest pain and leg swelling.  Gastrointestinal: Negative for abdominal pain, blood in stool, constipation, diarrhea, nausea and vomiting.  Genitourinary: Negative for difficulty urinating and hematuria.  Skin: Negative for color change.   Neurological: Negative for dizziness, weakness, light-headedness and headaches.  Hematological: Negative for adenopathy.  All other systems reviewed and are negative.      Objective:   Physical Exam Vitals reviewed.  Constitutional:      General: She is not in acute distress.    Appearance: Normal appearance. She is well-developed.  HENT:     Head: Normocephalic and atraumatic.  Eyes:     General:        Right eye: No discharge.        Left eye: No discharge.  Cardiovascular:     Rate and Rhythm: Normal rate and regular rhythm.     Heart sounds: Normal heart sounds.  Pulmonary:     Effort: Pulmonary effort is normal. No respiratory distress.     Breath sounds: Normal breath sounds.  Musculoskeletal:     Cervical back: Neck supple.  Skin:    General: Skin is warm and dry.  Neurological:     Mental Status: She is alert and oriented to person, place, and time.  Psychiatric:        Mood and Affect: Mood normal.        Behavior: Behavior normal.    Today's Vitals   04/28/20 1438  BP: 140/72  Pulse: 95  SpO2: 99%  Weight: 119 lb (54 kg)  Height: 4\' 11"  (1.499 m)   Body mass index is 24.04 kg/m.     Assessment:  Participant in health and wellness plan      Plan:     1. Keep all appts with PCP. Encouraged continued healthy eating and exercising. BP elevated today, encouraged low salt diet, instructed to f/u with PCP if she has consistently high BP readings at home greater than 140/90. F/u here prn.

## 2020-05-10 IMAGING — CT CT HEAD W/O CM
4 of 6 series · 17 of 47 positions shown, 19 images · non-contrast
Comparison: None.

CLINICAL DATA: Fall, hit back of head.

EXAM:
CT HEAD WITHOUT CONTRAST
TECHNIQUE: Contiguous axial images were obtained from the base of the skull
through the vertex without intravenous contrast.

[Series 2: head 5.0 h30s · axial · 0.41mm/px · z∈[+1160,+1270]mm · 7 of 30 slices shown, 9 images]
[im 4/30  brain]
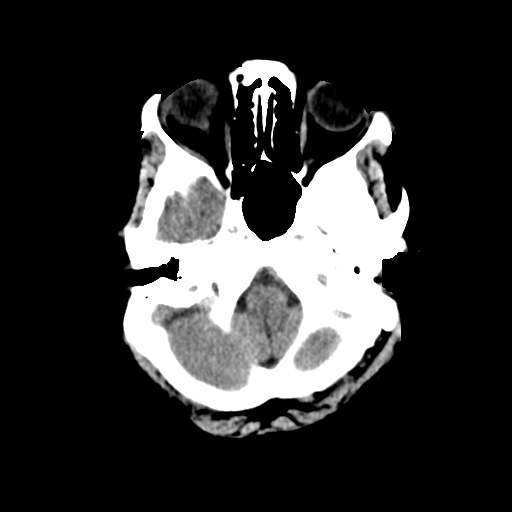
[im 4/30  bone]
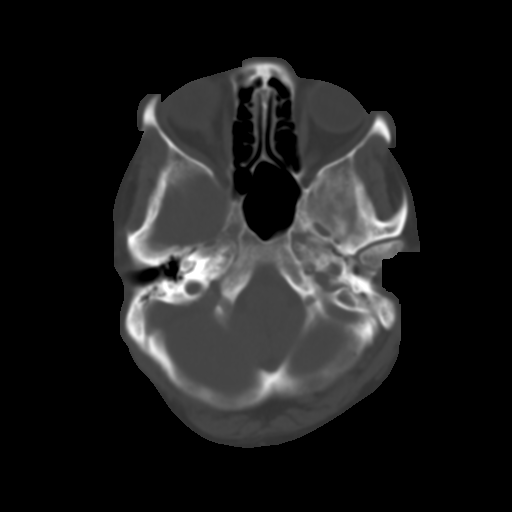
[im 8/30  brain]
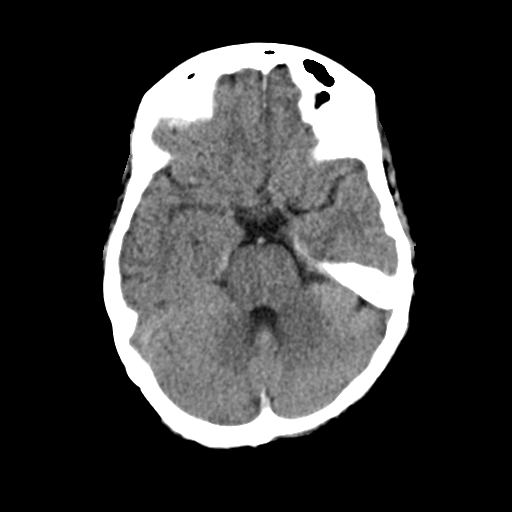
[im 11/30  brain]
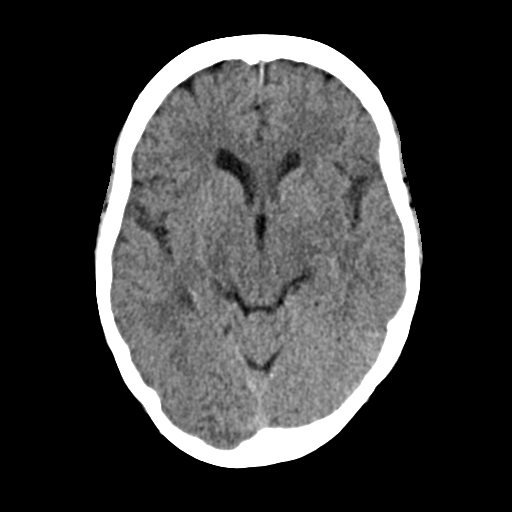
[im 15/30  brain]
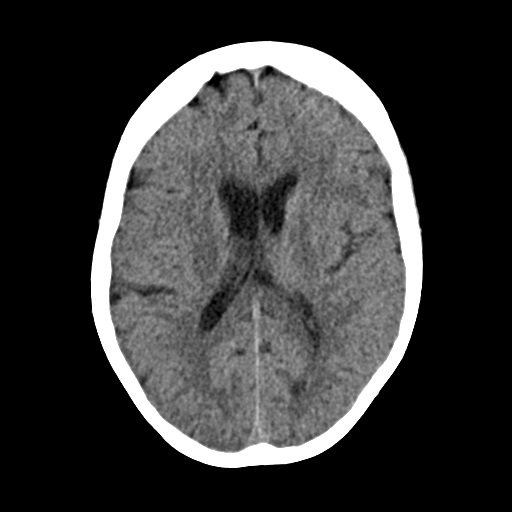
[im 19/30  brain]
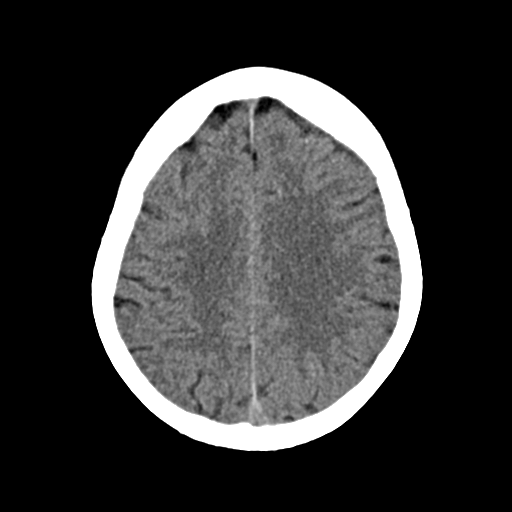
[im 19/30  bone]
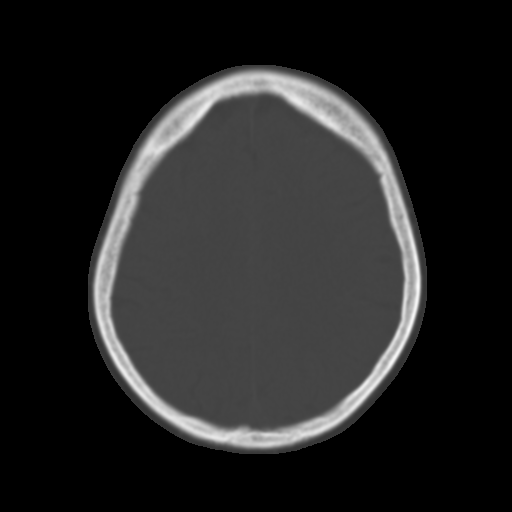
[im 22/30  brain]
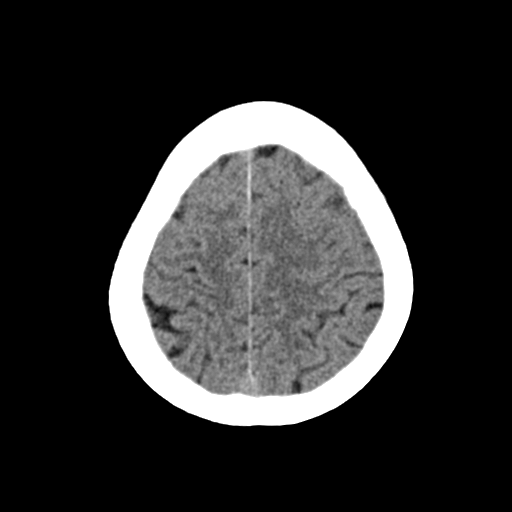
[im 26/30  brain]
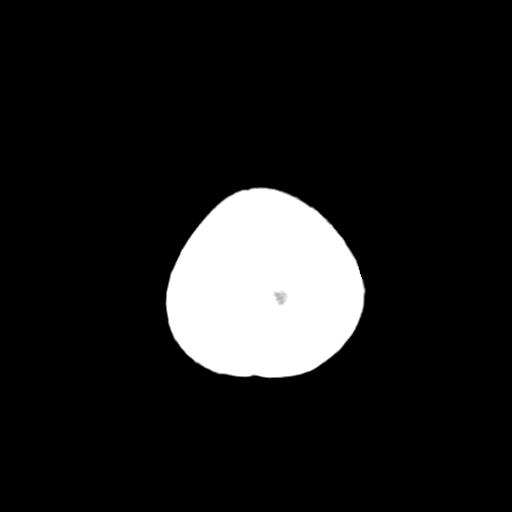

[Series 4: head 3.0 mpr cor · coronal · 0.30mm/px · 3 of 67 slices shown]
[im 25/67  brain]
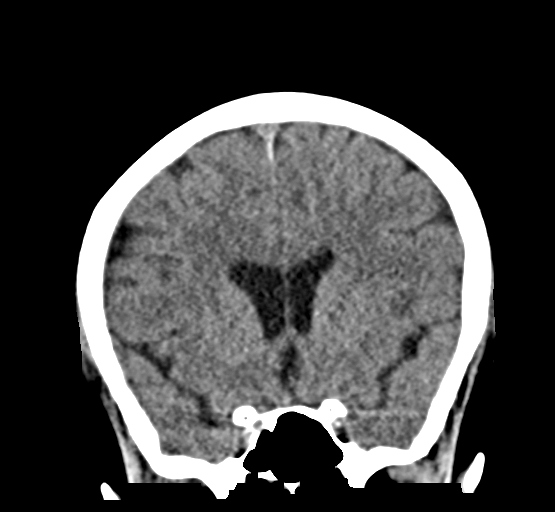
[im 34/67  brain]
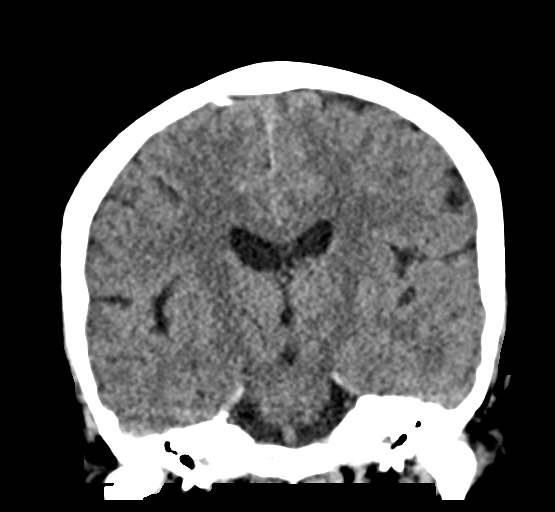
[im 42/67  brain]
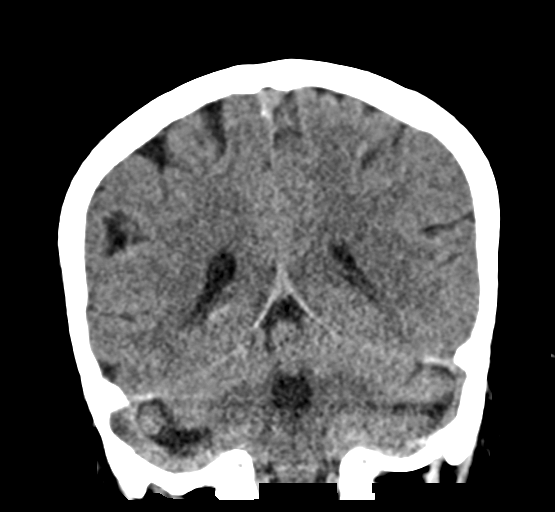

[Series 7: c_spine 2.0 i30s 3 · axial · 0.28mm/px · z∈[+1054,+1112]mm · 5 of 66 slices shown]
[im 7/66  brain]
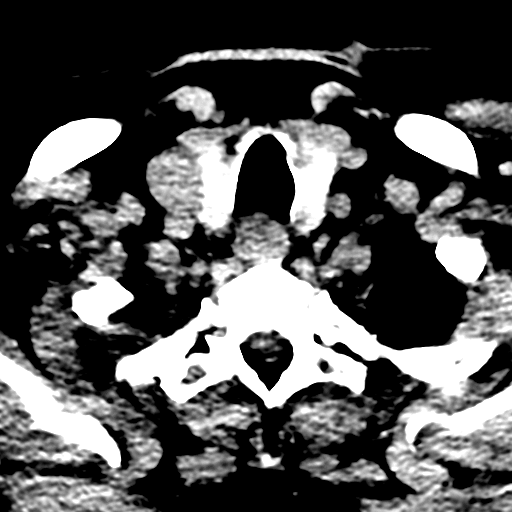
[im 14/66  brain]
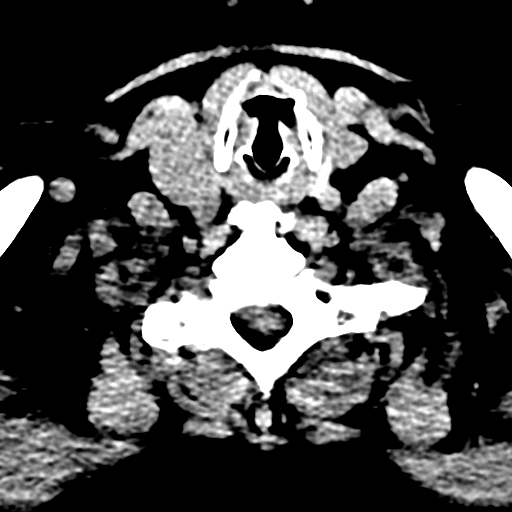
[im 20/66  brain]
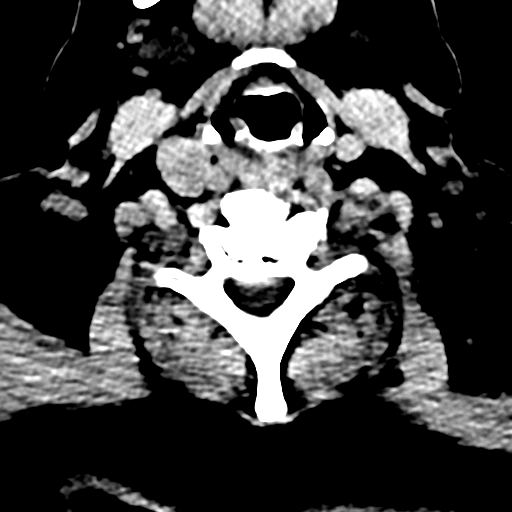
[im 30/66  brain]
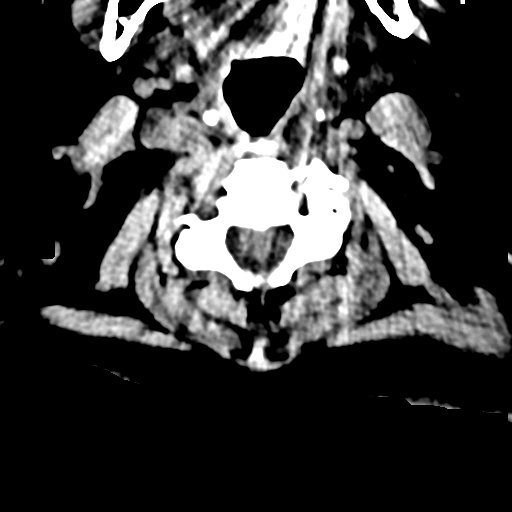
[im 36/66  brain]
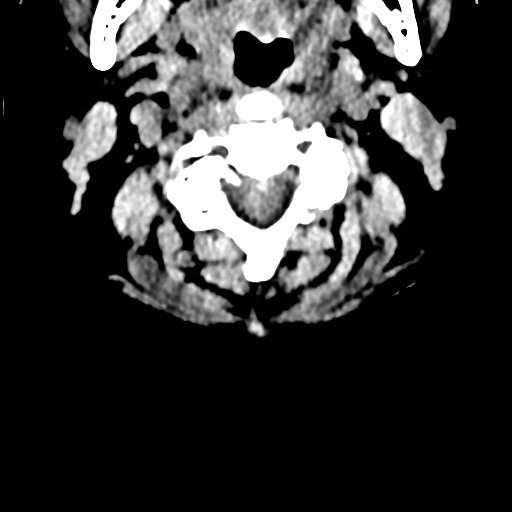

[Series 10: sagittals · sagittal · 0.28mm/px · 2 of 62 slices shown]
[im 21/62  brain]
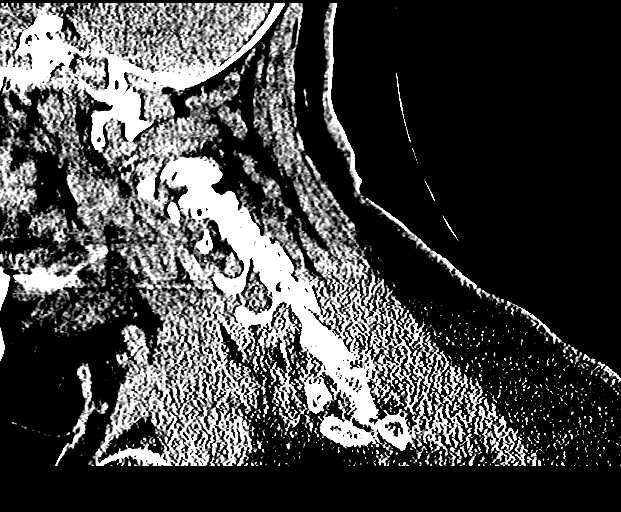
[im 41/62  brain]
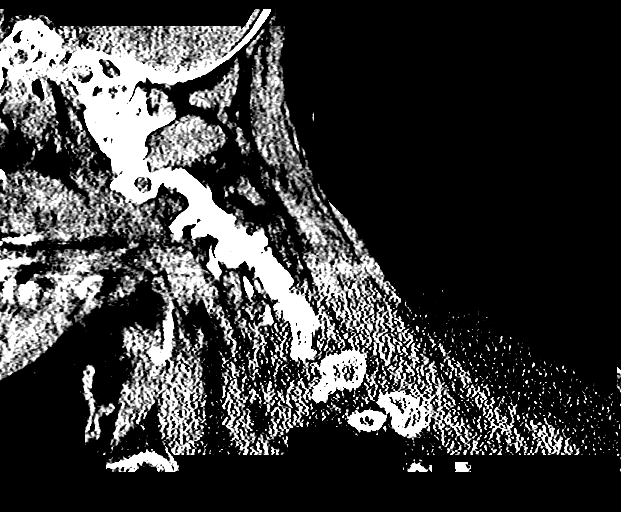

[17 of 47 positions shown; findings below may reference images not displayed]

FINDINGS: Brain: No acute intracranial abnormality. Specifically, no
hemorrhage, hydrocephalus, mass lesion, acute infarction, or
significant intracranial injury.

Vascular: No hyperdense vessel or unexpected calcification.

Skull: No acute calvarial abnormality.

Sinuses/Orbits: Visualized paranasal sinuses and mastoids clear.
Orbital soft tissues unremarkable.

Other: None
IMPRESSION: Normal study.

## 2020-08-24 ENCOUNTER — Other Ambulatory Visit (HOSPITAL_BASED_OUTPATIENT_CLINIC_OR_DEPARTMENT_OTHER): Payer: Self-pay | Admitting: Family Medicine

## 2020-08-24 DIAGNOSIS — R319 Hematuria, unspecified: Secondary | ICD-10-CM

## 2020-08-24 DIAGNOSIS — Z87442 Personal history of urinary calculi: Secondary | ICD-10-CM

## 2020-09-01 ENCOUNTER — Ambulatory Visit (HOSPITAL_BASED_OUTPATIENT_CLINIC_OR_DEPARTMENT_OTHER)
Admission: RE | Admit: 2020-09-01 | Discharge: 2020-09-01 | Disposition: A | Discharge status: Home / Self Care | Payer: PRIVATE HEALTH INSURANCE | Source: Ambulatory Visit | Attending: Family Medicine | Admitting: Family Medicine

## 2020-09-01 ENCOUNTER — Other Ambulatory Visit: Payer: Self-pay

## 2020-09-01 DIAGNOSIS — R319 Hematuria, unspecified: Secondary | ICD-10-CM | POA: Insufficient documentation

## 2020-09-01 DIAGNOSIS — Z87442 Personal history of urinary calculi: Secondary | ICD-10-CM | POA: Diagnosis present

## 2020-10-21 ENCOUNTER — Other Ambulatory Visit: Payer: Self-pay | Admitting: Urology

## 2020-10-21 DIAGNOSIS — N135 Crossing vessel and stricture of ureter without hydronephrosis: Secondary | ICD-10-CM

## 2020-10-21 NOTE — Progress Notes (Signed)
RN left 3 messaged for patient to call regarding procedure on Monday October 17,2022.

## 2020-10-23 NOTE — Discharge Instructions (Signed)
1 - You may have urinary urgency (bladder spasms), pass small stone fragments,  and bloody urine on / off with stent for up to 2 weeks.  This is normal.  2 - Call MD or go to ER for fever >102, severe pain / nausea / vomiting not relieved by medications, or acute change in medical status

## 2020-10-23 NOTE — H&P (Signed)
Yolanda Graham is an 60 y.o. female.    Chief Complaint: Pre-Op LEFT Shockwave Lithotripsy  HPI:   1 - LEFT Ureteral Stone - 58mm left ureteral stone by CT 08/2020 and persistant on KUB 10/2020 at L3-4 interspace. No fevers. Most recent UCX w/o clonal growth.  Today "Yolanda Graham" is seen to proceed with LEFT shockwave lithotripsy. NO interval fevers.   Past Medical History:  Diagnosis Date   Arthritis    Diabetes mellitus without complication (HCC)    diet controlled. no meds.   High cholesterol    Hypertension     Past Surgical History:  Procedure Laterality Date   CESAREAN SECTION      No family history on file. Social History:  reports that she has never smoked. She has never used smokeless tobacco. She reports current alcohol use. No history on file for drug use.  Allergies:  Allergies  Allergen Reactions   Oxycodone Nausea Only    No medications prior to admission.    No results found for this or any previous visit (from the past 48 hour(s)). No results found.  Review of Systems  Constitutional:  Negative for chills and fever.  Genitourinary:  Positive for flank pain.  All other systems reviewed and are negative.  There were no vitals taken for this visit. Physical Exam Vitals reviewed.  HENT:     Head: Normocephalic.     Nose: Nose normal.  Eyes:     Pupils: Pupils are equal, round, and reactive to light.  Cardiovascular:     Rate and Rhythm: Normal rate.  Pulmonary:     Effort: Pulmonary effort is normal.  Abdominal:     General: Abdomen is flat.  Genitourinary:    Comments: Mild left CVAT at present Musculoskeletal:     Cervical back: Normal range of motion.  Skin:    General: Skin is warm.  Neurological:     General: No focal deficit present.     Mental Status: She is alert.  Psychiatric:        Mood and Affect: Mood normal.     Assessment/Plan  Proceed as planned with LEFT shockwave lithotripsy. Risks, benefits, alternatives, expected  peri-treatment course reiterated today.   Sebastian Ache, MD 10/23/2020, 10:18 PM

## 2020-10-24 ENCOUNTER — Ambulatory Visit (HOSPITAL_COMMUNITY): Payer: PRIVATE HEALTH INSURANCE

## 2020-10-24 ENCOUNTER — Ambulatory Visit (HOSPITAL_BASED_OUTPATIENT_CLINIC_OR_DEPARTMENT_OTHER)
Admission: RE | Admit: 2020-10-24 | Discharge: 2020-10-24 | Disposition: A | Discharge status: Home / Self Care | Payer: PRIVATE HEALTH INSURANCE | Attending: Urology | Admitting: Urology

## 2020-10-24 ENCOUNTER — Encounter (HOSPITAL_BASED_OUTPATIENT_CLINIC_OR_DEPARTMENT_OTHER): Payer: Self-pay | Admitting: Urology

## 2020-10-24 ENCOUNTER — Encounter (HOSPITAL_BASED_OUTPATIENT_CLINIC_OR_DEPARTMENT_OTHER)
Admission: RE | Disposition: A | Discharge status: Home / Self Care | Payer: Self-pay | Source: Home / Self Care | Attending: Urology

## 2020-10-24 ENCOUNTER — Other Ambulatory Visit: Payer: Self-pay

## 2020-10-24 DIAGNOSIS — N201 Calculus of ureter: Secondary | ICD-10-CM | POA: Diagnosis present

## 2020-10-24 DIAGNOSIS — N135 Crossing vessel and stricture of ureter without hydronephrosis: Secondary | ICD-10-CM

## 2020-10-24 HISTORY — PX: EXTRACORPOREAL SHOCK WAVE LITHOTRIPSY: SHX1557

## 2020-10-24 SURGERY — LITHOTRIPSY, ESWL
Anesthesia: LOCAL | Laterality: Left

## 2020-10-24 MED ORDER — SODIUM CHLORIDE 0.9 % IV SOLN: INTRAVENOUS | Status: DC

## 2020-10-24 MED ORDER — DIAZEPAM 5 MG PO TABS
ORAL_TABLET | ORAL | Status: AC
  Filled 2020-10-24: qty 1

## 2020-10-24 MED ORDER — DIPHENHYDRAMINE HCL 25 MG PO CAPS
ORAL_CAPSULE | ORAL | Status: AC
  Filled 2020-10-24: qty 1

## 2020-10-24 MED ORDER — CIPROFLOXACIN HCL 500 MG PO TABS
ORAL_TABLET | ORAL | Status: AC
  Filled 2020-10-24: qty 1

## 2020-10-24 MED ORDER — TRAMADOL HCL 50 MG PO TABS: 50.0000 mg | ORAL_TABLET | Freq: Four times a day (QID) | ORAL | 0 refills | Status: AC | PRN

## 2020-10-24 MED ORDER — CIPROFLOXACIN HCL 500 MG PO TABS
500.0000 mg | ORAL_TABLET | ORAL | Status: AC
  Administered 2020-10-24: 500 mg via ORAL

## 2020-10-24 MED ORDER — ONDANSETRON 8 MG PO TBDP
8.0000 mg | ORAL_TABLET | Freq: Three times a day (TID) | ORAL | 0 refills | Status: DC | PRN
Start: 2020-10-24 — End: 2023-04-11

## 2020-10-24 MED ORDER — DIAZEPAM 5 MG PO TABS
10.0000 mg | ORAL_TABLET | ORAL | Status: AC
  Administered 2020-10-24: 10 mg via ORAL

## 2020-10-24 MED ORDER — DIAZEPAM 5 MG PO TABS
ORAL_TABLET | ORAL | Status: AC
  Filled 2020-10-24: qty 2

## 2020-10-24 MED ORDER — SENNOSIDES-DOCUSATE SODIUM 8.6-50 MG PO TABS: 1.0000 | ORAL_TABLET | Freq: Two times a day (BID) | ORAL | 0 refills | Status: DC

## 2020-10-24 MED ORDER — DIPHENHYDRAMINE HCL 25 MG PO CAPS
25.0000 mg | ORAL_CAPSULE | ORAL | Status: AC
  Administered 2020-10-24: 25 mg via ORAL

## 2020-10-24 NOTE — Brief Op Note (Signed)
10/24/2020  12:24 PM  PATIENT:  Yolanda Graham  60 y.o. female  PRE-OPERATIVE DIAGNOSIS:  LEFT URETERO PELVIC JUNCTION STONE  POST-OPERATIVE DIAGNOSIS:  * No post-op diagnosis entered *  PROCEDURE:  Procedure(s) with comments: EXTRACORPOREAL SHOCK WAVE LITHOTRIPSY (ESWL) (Left) - ONLY NEEDS 75 MIN  SURGEON:  Surgeon(s) and Role:    * Alexis Frock, MD - Primary  PHYSICIAN ASSISTANT:   ASSISTANTS: none   ANESTHESIA:   MAC  EBL:  minimal   BLOOD ADMINISTERED:none  DRAINS: none   LOCAL MEDICATIONS USED:  NONE  SPECIMEN:  No Specimen  DISPOSITION OF SPECIMEN:  N/A  COUNTS:  YES  TOURNIQUET:  * No tourniquets in log *  DICTATION: .Note written in paper chart  PLAN OF CARE: Discharge to home after PACU  PATIENT DISPOSITION:  Short Stay   Delay start of Pharmacological VTE agent (>24hrs) due to surgical blood loss or risk of bleeding: not applicable

## 2020-10-25 ENCOUNTER — Encounter (HOSPITAL_BASED_OUTPATIENT_CLINIC_OR_DEPARTMENT_OTHER): Payer: Self-pay | Admitting: Urology

## 2021-02-01 ENCOUNTER — Other Ambulatory Visit (HOSPITAL_COMMUNITY)
Admission: RE | Admit: 2021-02-01 | Discharge: 2021-02-01 | Disposition: A | Discharge status: Home / Self Care | Payer: PRIVATE HEALTH INSURANCE | Source: Ambulatory Visit | Attending: Family Medicine | Admitting: Family Medicine

## 2021-02-01 ENCOUNTER — Other Ambulatory Visit: Payer: Self-pay | Admitting: Family Medicine

## 2021-02-01 DIAGNOSIS — Z01411 Encounter for gynecological examination (general) (routine) with abnormal findings: Secondary | ICD-10-CM | POA: Insufficient documentation

## 2021-02-03 LAB — CYTOLOGY - PAP
Comment: NEGATIVE
Diagnosis: NEGATIVE
High risk HPV: NEGATIVE

## 2021-10-06 ENCOUNTER — Encounter: Payer: Self-pay | Admitting: Family

## 2021-10-06 ENCOUNTER — Ambulatory Visit: Payer: PRIVATE HEALTH INSURANCE | Admitting: Family

## 2021-10-06 VITALS — BP 148/78 | HR 103 | Temp 98.2°F | Ht 59.0 in | Wt 115.0 lb

## 2021-10-06 DIAGNOSIS — Z789 Other specified health status: Secondary | ICD-10-CM

## 2021-10-06 NOTE — Progress Notes (Signed)
Subjective:     Patient ID: Yolanda Graham, female   DOB: 04-Sep-1960, 61 y.o.   MRN: [id number]  HPI  Elin presents [facility] the [facility] today for her required wellness exam for insurance. Her PCP is Dr. Paulino Rily. She is UTD on her health maintenance items. Gets mammogram yearly, last done end of 2022 per patient. She also states her colonoscopy was done 4-5 years ago and she was told to f/u in 10 years. She eats a healthy diet, she is vegetarian, and does yard work and takes walks during the warmer months. She denies any problems or concerns today.  Past Medical History:  Diagnosis Date   Arthritis    Diabetes mellitus without complication (HCC)    diet controlled. no meds.   High cholesterol    Hypertension    Allergies  Allergen Reactions   Oxycodone Nausea Only    Current Outpatient Medications:    aspirin 81 MG tablet, Take 81 mg by mouth daily., Disp: , Rfl:    atorvastatin (LIPITOR) 10 MG tablet, Take 10 mg by mouth every evening., Disp: , Rfl:    Cholecalciferol (VITAMIN D PO), Take 1 capsule by mouth daily. 600units, Disp: , Rfl:    Cyanocobalamin (VITAMIN B-12 PO), Take by mouth., Disp: , Rfl:    ferrous sulfate 325 (65 FE) MG tablet, Take 325 mg by mouth daily with breakfast., Disp: , Rfl:    losartan (COZAAR) 25 MG tablet, Take 25 mg by mouth daily., Disp: , Rfl:    ondansetron (ZOFRAN-ODT) 8 MG disintegrating tablet, Take 1 tablet (8 mg total) by mouth every 8 (eight) hours as needed for nausea or vomiting. After lithotripsy, Disp: 20 tablet, Rfl: 0   senna-docusate (SENOKOT-S) 8.6-50 MG tablet, Take 1 tablet by mouth 2 (two) times daily. While taking strong pain meds to prevent constipation., Disp: 10 tablet, Rfl: 0   traMADol (ULTRAM) 50 MG tablet, Take 1-2 tablets (50-100 mg total) by mouth every 6 (six) hours as needed for moderate pain or severe pain (after lithotripsy)., Disp: 20 tablet, Rfl: 0    Review of Systems  Constitutional:  Negative  for chills, fatigue, fever and unexpected weight change.  HENT:  Negative for congestion, ear pain, sinus pressure, sinus pain and sore throat.   Eyes:  Negative for discharge and visual disturbance.  Respiratory:  Negative for cough, shortness of breath and wheezing.   Cardiovascular:  Negative for chest pain and leg swelling.  Gastrointestinal:  Negative for abdominal pain, blood in stool, constipation, diarrhea, nausea and vomiting.  Genitourinary:  Negative for difficulty urinating and hematuria.  Skin:  Negative for color change.  Neurological:  Negative for dizziness, weakness, light-headedness and headaches.  Hematological:  Negative for adenopathy.  All other systems reviewed and are negative.      Objective:   Physical Exam Vitals reviewed.  Constitutional:      General: She is not in acute distress.    Appearance: Normal appearance. She is well-developed.  HENT:     Head: Normocephalic and atraumatic.  Eyes:     General:        Right eye: No discharge.        Left eye: No discharge.  Cardiovascular:     Rate and Rhythm: Normal rate and regular rhythm.     Heart sounds: Normal heart sounds.  Pulmonary:     Effort: Pulmonary effort is normal. No respiratory distress.     Breath sounds: Normal breath sounds.  Musculoskeletal:     Cervical back: Neck supple.  Skin:    General: Skin is warm and dry.  Neurological:     Mental Status: She is alert and oriented to person, place, and time.  Psychiatric:        Mood and Affect: Mood normal.        Behavior: Behavior normal.    Today's Vitals   10/06/21 2139  BP: (!) 148/78  Pulse: (!) 103  Temp: 98.2 F (36.8 C)  SpO2: 98%  Weight: 115 lb (52.2 kg)  Height: 4\' 11"  (1.499 m)   Body mass index is 23.23 kg/m.     Assessment:     Participant in health and wellness plan      Plan:     1. Keep all appts with PCP. Encouraged continued healthy eating and exercising. BP elevated today, encouraged low salt diet,  instructed to f/u with PCP if she has consistently high BP readings at home greater than 140/90. F/u here prn.

## 2022-10-04 ENCOUNTER — Encounter: Payer: Self-pay | Admitting: Nurse Practitioner

## 2022-10-04 ENCOUNTER — Ambulatory Visit: Payer: PRIVATE HEALTH INSURANCE | Admitting: Nurse Practitioner

## 2022-10-04 VITALS — BP 151/84 | HR 84 | Ht <= 58 in | Wt 115.0 lb

## 2022-10-04 DIAGNOSIS — Z789 Other specified health status: Secondary | ICD-10-CM

## 2022-10-04 DIAGNOSIS — I10 Essential (primary) hypertension: Secondary | ICD-10-CM

## 2022-10-04 NOTE — Progress Notes (Signed)
[facility]  Subjective:  Patient ID: Yolanda Graham, female    DOB: 28-Oct-1960  Age: 62 y.o. MRN: [id number]  CC: Wellness exam  HPI Yolanda Graham presents for wellness exam visit for insurance benefit.  Patient has a PCP: Mila Palmer, MD PMH significant for: Hypertension, DM, HDL Last labs per PCP were completed within last year.   Health Maintenance:  Colonoscopy: 2023 Mammogram: 2023, scheduled with PCP Pap: January 2024 with PCP    Smoker: No  Immunizations:  Shingrix-  x2 COVID- x4 Tdap- Completed Flu- 2023  Lifestyle: Diet- follows low-carb, low-cholesterol diet Exercise- Walks, has active job     Past Medical History:  Diagnosis Date   Arthritis    Diabetes mellitus without complication (HCC)    diet controlled. no meds.   High cholesterol    Hypertension     Past Surgical History:  Procedure Laterality Date   CESAREAN SECTION     EXTRACORPOREAL SHOCK WAVE LITHOTRIPSY Left 10/24/2020   Procedure: EXTRACORPOREAL SHOCK WAVE LITHOTRIPSY (ESWL);  Surgeon: Sebastian Ache, MD;  Location: [facility];  Service: Urology;  Laterality: Left;  ONLY NEEDS 75 MIN    Outpatient Medications Prior to Visit  Medication Sig Dispense Refill   aspirin 81 MG tablet Take 81 mg by mouth daily.     atorvastatin (LIPITOR) 10 MG tablet Take 10 mg by mouth every evening.     Cholecalciferol (VITAMIN D PO) Take 1 capsule by mouth daily. 600units     Cyanocobalamin (VITAMIN B-12 PO) Take by mouth.     ferrous sulfate 325 (65 FE) MG tablet Take 325 mg by mouth daily with breakfast.     losartan (COZAAR) 25 MG tablet Take 25 mg by mouth daily.     ondansetron (ZOFRAN-ODT) 8 MG disintegrating tablet Take 1 tablet (8 mg total) by mouth every 8 (eight) hours as needed for nausea or vomiting. After lithotripsy 20 tablet 0   senna-docusate (SENOKOT-S) 8.6-50 MG tablet Take 1 tablet by mouth 2 (two) times daily. While taking strong pain meds to  prevent constipation. 10 tablet 0   No facility-administered medications prior to visit.    ROS Review of Systems  Constitutional:  Negative for chills, fatigue and fever.  HENT:  Negative for sore throat and trouble swallowing.   Respiratory:  Negative for shortness of breath and wheezing.   Cardiovascular:  Negative for chest pain, palpitations and leg swelling.  Gastrointestinal:  Negative for constipation, diarrhea, nausea and vomiting.  Musculoskeletal:  Positive for joint swelling. Negative for back pain and neck pain.  Neurological:  Negative for weakness and numbness.  Psychiatric/Behavioral:  Negative for sleep disturbance.     Objective:  There were no vitals taken for this visit.  Physical Exam Constitutional:      Appearance: Normal appearance. She is normal weight. She is not ill-appearing.  HENT:     Right Ear: Tympanic membrane, ear canal and external ear normal.     Left Ear: Tympanic membrane, ear canal and external ear normal.     Nose: No congestion.     Mouth/Throat:     Mouth: Mucous membranes are moist.     Pharynx: No posterior oropharyngeal erythema.  Eyes:     Pupils: Pupils are equal, round, and reactive to light.  Cardiovascular:     Rate and Rhythm: Normal rate and regular rhythm.     Heart sounds: Murmur heard.  Pulmonary:     Effort: Pulmonary effort is normal.  Breath sounds: Normal breath sounds.  Abdominal:     General: Abdomen is flat. Bowel sounds are normal.  Skin:    General: Skin is warm and dry.  Neurological:     General: No focal deficit present.     Mental Status: Mental status is at baseline.  Psychiatric:        Mood and Affect: Mood normal.        Behavior: Behavior normal.        Thought Content: Thought content normal.        Judgment: Judgment normal.    Assessment & Plan:  1. Participant in health and wellness plan Adult wellness physical was conducted today. Importance of diet and exercise were discussed in  detail.  We reviewed immunizations and gave recommendations regarding current immunization needed for age.  Preventative health exams are up to date.   Patient was advised yearly wellness exam   2. Hypertension, unspecified type -Discussed that blood pressure needs to be under 130/80 and instructed to monitor BP at home, and follow up with PCP if needed.   Follow-up: As needed

## 2023-02-26 ENCOUNTER — Other Ambulatory Visit (HOSPITAL_BASED_OUTPATIENT_CLINIC_OR_DEPARTMENT_OTHER): Payer: Self-pay

## 2023-02-26 DIAGNOSIS — I10 Essential (primary) hypertension: Secondary | ICD-10-CM

## 2023-02-26 NOTE — Progress Notes (Signed)
Referral obtained from ED for echo from Mila Palmer from [place] [facility] [place]. Order has been placed to f/u with heart murmur.

## 2023-03-21 ENCOUNTER — Other Ambulatory Visit (HOSPITAL_BASED_OUTPATIENT_CLINIC_OR_DEPARTMENT_OTHER): Payer: PRIVATE HEALTH INSURANCE

## 2023-04-11 ENCOUNTER — Encounter: Payer: Self-pay | Admitting: Nurse Practitioner

## 2023-04-11 ENCOUNTER — Ambulatory Visit: Payer: PRIVATE HEALTH INSURANCE | Admitting: Nurse Practitioner

## 2023-04-11 VITALS — BP 146/78 | HR 88

## 2023-04-11 DIAGNOSIS — H6122 Impacted cerumen, left ear: Secondary | ICD-10-CM

## 2023-04-11 DIAGNOSIS — Z789 Other specified health status: Secondary | ICD-10-CM

## 2023-04-11 NOTE — Progress Notes (Signed)
 [facility]  Subjective:  Patient ID: Yolanda Graham, female    DOB: 1960/08/26  Age: 63 y.o. MRN: [id number]  CC: wellness exam  HPI Yolanda Graham presents for wellness exam visit for insurance benefit.  Patient has a PCP: Mila Palmer PMH significant for: HTN, HLD, DM Last labs per PCP were completed: Feb 2025  Health Maintenance:  Colonoscopy: due 2028 Mammogram: Feb 2025 Pap: 2023, no follow-up recommended    Smoker: never   Immunizations:  Shingrix- completed   Flu- yearly Tdap- May 2025  Lifestyle: Diet- eats small portions.  Exercise-does not exercise due to tear in meniscus,  but stays active at work.    Reports decreased hearing in left ear x a few days. No ringing in the ears at this time.   Past Medical History:  Diagnosis Date   Arthritis    Diabetes mellitus without complication (HCC)    diet controlled. no meds.   High cholesterol    Hypertension     Past Surgical History:  Procedure Laterality Date   CESAREAN SECTION     EXTRACORPOREAL SHOCK WAVE LITHOTRIPSY Left 10/24/2020   Procedure: EXTRACORPOREAL SHOCK WAVE LITHOTRIPSY (ESWL);  Surgeon: Sebastian Ache, MD;  Location: [facility];  Service: Urology;  Laterality: Left;  ONLY NEEDS 75 MIN    Outpatient Medications Prior to Visit  Medication Sig Dispense Refill   atorvastatin (LIPITOR) 10 MG tablet Take 10 mg by mouth every evening.     Cholecalciferol (VITAMIN D PO) Take 1 capsule by mouth daily. 600units     Cyanocobalamin (VITAMIN B-12 PO) Take by mouth.     ferrous sulfate 325 (65 FE) MG tablet Take 325 mg by mouth daily with breakfast.     losartan (COZAAR) 25 MG tablet Take 25 mg by mouth daily.     aspirin 81 MG tablet Take 81 mg by mouth daily. (Patient not taking: Reported on 04/11/2023)     ondansetron (ZOFRAN-ODT) 8 MG disintegrating tablet Take 1 tablet (8 mg total) by mouth every 8 (eight) hours as needed for nausea or vomiting. After lithotripsy  (Patient not taking: Reported on 10/04/2022) 20 tablet 0   senna-docusate (SENOKOT-S) 8.6-50 MG tablet Take 1 tablet by mouth 2 (two) times daily. While taking strong pain meds to prevent constipation. (Patient not taking: Reported on 10/04/2022) 10 tablet 0   No facility-administered medications prior to visit.    ROS Review of Systems  HENT:  Positive for hearing loss (left ear).   Respiratory:  Negative for shortness of breath.   Cardiovascular:  Negative for chest pain.  Gastrointestinal:  Negative for constipation, diarrhea, nausea and vomiting.  Musculoskeletal:  Positive for arthralgias.  Neurological:  Negative for dizziness and headaches.  Psychiatric/Behavioral:  Negative for sleep disturbance.     Objective:  BP (!) 146/78 (BP Location: Left Arm, Patient Position: Sitting, Cuff Size: Normal)   Pulse 88   SpO2 98%   Physical Exam Constitutional:      General: She is not in acute distress. HENT:     Head: Normocephalic.     Right Ear: Tympanic membrane, ear canal and external ear normal.     Left Ear: There is impacted cerumen.     Mouth/Throat:     Mouth: Mucous membranes are moist.     Pharynx: No oropharyngeal exudate.  Eyes:     Pupils: Pupils are equal, round, and reactive to light.  Cardiovascular:     Rate and Rhythm: Normal rate and  regular rhythm.     Heart sounds: Murmur heard.  Pulmonary:     Effort: Pulmonary effort is normal.  Musculoskeletal:        General: Normal range of motion.     Right lower leg: No edema.     Left lower leg: No edema.  Skin:    General: Skin is warm.  Neurological:     General: No focal deficit present.     Mental Status: She is alert and oriented to person, place, and time.  Psychiatric:        Mood and Affect: Mood normal.        Behavior: Behavior normal.      Assessment & Plan:    Yolanda Graham was seen today for wellness exam.  Diagnoses and all orders for this visit:  Participant in health and wellness  plan Adult wellness physical was conducted today. Importance of diet and exercise were discussed in detail.  We reviewed immunizations and gave recommendations regarding current immunization needed for age.  Preventative health exams are up to date.   Patient was advised yearly wellness exam  Excessive cerumen in left ear canal Plan for otc debrox for a few days, repeat eval next week and can attempt lavage if needed.    No orders of the defined types were placed in this encounter.   No orders of the defined types were placed in this encounter.   Follow-up: as needed.

## 2023-04-18 ENCOUNTER — Ambulatory Visit: Payer: Self-pay | Admitting: Nurse Practitioner

## 2024-02-06 ENCOUNTER — Encounter: Payer: Self-pay | Admitting: Nurse Practitioner

## 2024-02-06 ENCOUNTER — Ambulatory Visit: Payer: Self-pay | Admitting: Nurse Practitioner

## 2024-02-06 VITALS — BP 140/70 | HR 89 | Temp 97.8°F

## 2024-02-06 DIAGNOSIS — Z789 Other specified health status: Secondary | ICD-10-CM

## 2024-02-06 NOTE — Progress Notes (Signed)
 [facility]  Subjective:  Patient ID: Yolanda Graham, female    DOB: [birth year]  Age: 64 y.o. MRN: [id number]  CC: wellness exam   HPI Yolanda Graham presents for wellness exam visit for insurance benefit.  Patient has a PCP: Dr. Verena  PMH significant for: Diabetes diet controlled, HTN on losartan.  Last labs per PCP were completed: Oct 2025  Health Maintenance:  Colonoscopy: due to 2028 Mammogram: scheduled 2026 Pap: 2023, no further follow-up    Smoker: never  Immunizations:  Shingrix-  completed  Flu-  yearly Tdap: unsure of last vaccine   Lifestyle: Diet- limiting carbs and sweet. Exercise- stretches for legs.     Past Medical History:  Diagnosis Date   Arthritis    Diabetes mellitus without complication (HCC)    diet controlled. no meds.   High cholesterol    Hypertension     Past Surgical History:  Procedure Laterality Date   CESAREAN SECTION     EXTRACORPOREAL SHOCK WAVE LITHOTRIPSY Left 10/24/2020   Procedure: EXTRACORPOREAL SHOCK WAVE LITHOTRIPSY (ESWL);  Surgeon: Alvaro Hummer, MD;  Location: [facility];  Service: Urology;  Laterality: Left;  ONLY NEEDS 75 MIN    Outpatient Medications Prior to Visit  Medication Sig Dispense Refill   atorvastatin (LIPITOR) 10 MG tablet Take 10 mg by mouth every evening.     calcium carbonate (OSCAL) 1500 (600 Ca) MG TABS tablet Take 1,500 mg by mouth daily with breakfast.     Cholecalciferol (VITAMIN D PO) Take 1 capsule by mouth daily. 600units     Cyanocobalamin (VITAMIN B-12 PO) Take by mouth.     ferrous sulfate 325 (65 FE) MG tablet Take 325 mg by mouth daily with breakfast.     losartan (COZAAR) 25 MG tablet Take 25 mg by mouth daily.     No facility-administered medications prior to visit.    ROS Review of Systems  HENT:  Positive for hearing loss (left).   Eyes:  Negative for visual disturbance.  Respiratory:  Negative for shortness of breath.   Cardiovascular:   Negative for chest pain.  Gastrointestinal:  Negative for nausea and vomiting.  Musculoskeletal:  Positive for arthralgias.  Neurological:  Negative for dizziness and headaches.    Objective:  BP (!) 140/70 (BP Location: Right Arm, Patient Position: Sitting, Cuff Size: Normal)   Pulse 89   Temp 97.8 F (36.6 C) (Oral)   SpO2 100%   Physical Exam Constitutional:      General: She is not in acute distress. HENT:     Head: Normocephalic.     Right Ear: Tympanic membrane, ear canal and external ear normal.     Left Ear: Tympanic membrane, ear canal and external ear normal.     Nose: Nose normal.     Mouth/Throat:     Pharynx: No oropharyngeal exudate or posterior oropharyngeal erythema.  Eyes:     Pupils: Pupils are equal, round, and reactive to light.  Cardiovascular:     Rate and Rhythm: Normal rate and regular rhythm.     Pulses: Normal pulses.     Heart sounds: Normal heart sounds.  Pulmonary:     Effort: Pulmonary effort is normal.     Breath sounds: Normal breath sounds.  Abdominal:     General: Bowel sounds are normal.     Palpations: Abdomen is soft.  Musculoskeletal:        General: Normal range of motion.  Neurological:     General:  No focal deficit present.     Mental Status: She is alert and oriented to person, place, and time.  Psychiatric:        Mood and Affect: Mood normal.        Behavior: Behavior normal.      Assessment & Plan:    Athira was seen today for wellness exam.  Diagnoses and all orders for this visit:  Participant in health and wellness plan Adult wellness physical was conducted today. Importance of diet and exercise were discussed in detail.  We reviewed immunizations and gave recommendations regarding current immunization needed for age. Will follow-up for Tdap if needed. Preventative health exams are up to date. Patient was advised yearly wellness exam and follow-up with PCP as scheduled.     No orders of the defined types were  placed in this encounter.   No orders of the defined types were placed in this encounter.   Follow-up: as needed.
